# Patient Record
Sex: Male | Born: 1990 | Race: White | Hispanic: Yes | Marital: Single | Smoking: Current every day smoker
Health system: Southern US, Community
[De-identification: ages and names within clinical notes are randomized; demographics above are authoritative.]

---

## 2016-06-03 ENCOUNTER — Ambulatory Visit (INDEPENDENT_AMBULATORY_CARE_PROVIDER_SITE_OTHER): Admitting: Family Medicine

## 2016-06-03 ENCOUNTER — Ambulatory Visit (INDEPENDENT_AMBULATORY_CARE_PROVIDER_SITE_OTHER)

## 2016-06-03 VITALS — BP 114/62 | HR 65 | Temp 98.0°F | Resp 18 | Ht 66.0 in | Wt 164.0 lb

## 2016-06-03 DIAGNOSIS — M25551 Pain in right hip: Secondary | ICD-10-CM

## 2016-06-03 MED ORDER — PREDNISONE 20 MG PO TABS: ORAL_TABLET | ORAL | Status: AC

## 2016-06-03 MED ORDER — HYDROCODONE-ACETAMINOPHEN 5-325 MG PO TABS: 1.0000 | ORAL_TABLET | ORAL | Status: AC | PRN

## 2016-06-03 NOTE — Progress Notes (Addendum)
Subjective:  By signing my name below, I, Christopher Harper, attest that this documentation has been prepared under the direction and in the presence of Christopher SorensonEva Malyssa Maris, MD. Electronically Signed: Stann Oresung-Kai Harper, Scribe. 06/03/2016 , 7:56 PM .  Patient was seen in Room 11 .   Patient ID: Christopher Harper, male    DOB: [birth year], 25 y.o.   MRN: [id number] Chief Complaint  Patient presents with  . Hip Pain    right hip, started yesterday   HPI Christopher CarawayMatthew Arp is a 25 y.o. male who [facility] complaining of right hip pain that started yesterday. He woke up with some soreness in his hip. He felt it when got into car this morning as well. The pain worsened over the course of the day today. He has discomfort when he sits with his legs are closer together. He denies taking medication for his pain today. He denies aggravating pain when coughing or sneezing. He's in the [employer].   History reviewed. No pertinent past medical history. Prior to Admission medications   Not on File   No Known Allergies  Review of Systems  Constitutional: Negative for fever, chills, activity change and fatigue.  Musculoskeletal: Positive for arthralgias. Negative for myalgias, back pain, joint swelling and gait problem.  Skin: Negative for rash and wound.  Neurological: Negative for weakness and numbness.       Objective:   Physical Exam  Constitutional: He is oriented to person, place, and time. He appears well-developed and well-nourished. No distress.  HENT:  Head: Normocephalic and atraumatic.  Eyes: EOM are normal. Pupils are equal, round, and reactive to light.  Neck: Neck supple.  Cardiovascular: Normal rate.   Pulmonary/Chest: Effort normal. No respiratory distress.  Musculoskeletal: Normal range of motion.  Pain with all passive and active ROM, passive flexion limited to 90 degrees with external rotation moderately reduced and internal rotation mildly reduced, tender to palpation over IT band with  minimal tenderness over greater trochanter and SI joint  Neurological: He is alert and oriented to person, place, and time.  Skin: Skin is warm and dry.  Psychiatric: He has a normal mood and affect. His behavior is normal.  Nursing note and vitals reviewed.   BP 114/62 mmHg  Pulse 65  Temp(Src) 98 F (36.7 C) (Oral)  Resp 18  Ht 5\' 6"  (1.676 m)  Wt 164 lb (74.39 kg)  BMI 26.48 kg/m2  SpO2 98%   Dg Hip Unilat W Or W/o Pelvis 2-3 Views Right  06/03/2016  CLINICAL DATA:  Acute hip pain, limited range of movement. EXAM: DG HIP (WITH OR WITHOUT PELVIS) 2-3V RIGHT COMPARISON:  None. FINDINGS: Single view of the pelvis and two views of the right hip are provided. Osseous alignment is normal. Bone mineralization is normal. No fracture line or displaced fracture fragment seen. Probable osseous spurring along the superior-lateral margin of the right acetabulum, suspicious for sequela of previous labral injury. IMPRESSION: Probable osseous spur along the lateral margin of the right acetabulum, suspicious for sequela of previous labral injury. Given the history of limited range of motion, would consider further characterization with nonemergent hip MRI. No acute findings. Electronically Signed   By: Bary RichardStan  Maynard M.D.   On: 06/03/2016 20:15      Assessment & Plan:   1. Hip pain, acute, right   Pt with numerous minor injuries over the years (is active military so very physical job) but no specific h/o hip injury now with insidious onset of hip pain  yesterday - full ROM limited in all directions.  Xray shows bony growth on the lateral right acetabulum - poss calcification of prior injury?  Needs MRI to better eval. Treat acutely with pred taper and refer to ortho for further mngmnt.  Rec limited walking, no running until cleared by ortho.  Orders Placed This Encounter  Procedures  . DG HIP UNILAT W OR W/O PELVIS 2-3 VIEWS RIGHT    Standing Status: Future     Number of Occurrences: 1     Standing  Expiration Date: 06/03/2017    Order Specific Question:  Reason for Exam (SYMPTOM  OR DIAGNOSIS REQUIRED)    Answer:  acute bony hip pain, worse wiht all ROM, limited flexion and ext rotation, nki but in [employer]    Order Specific Question:  Preferred imaging location?    Answer:  External  . MR Hip Right Wo Contrast    Standing Status: Future     Number of Occurrences:      Standing Expiration Date: 08/03/2017    Order Specific Question:  Reason for Exam (SYMPTOM  OR DIAGNOSIS REQUIRED)    Answer:  further evaluate hip abnormality - bone spur on labrum    Order Specific Question:  Preferred imaging location?    Answer:  [facility] (table limit-550lbs)    Order Specific Question:  What is the patient's sedation requirement?    Answer:  No Sedation    Order Specific Question:  Does the patient have a pacemaker or implanted devices?    Answer:  No  . Ambulatory referral to Orthopedic Surgery    Referral Priority:  Routine    Referral Type:  Surgical    Referral Reason:  Specialty Services Required    Requested Specialty:  Orthopedic Surgery    Number of Visits Requested:  1    Meds ordered this encounter  Medications  . predniSONE (DELTASONE) 20 MG tablet    Sig: 3 tabs po qd x2d, 2 tabs po qd x 2d, 1 tab po qd x 2d    Dispense:  12 tablet    Refill:  0  . HYDROcodone-acetaminophen (NORCO/VICODIN) 5-325 MG tablet    Sig: Take 1 tablet by mouth every 4 (four) hours as needed for moderate pain.    Dispense:  40 tablet    Refill:  0    I personally performed the services described in this documentation, which was scribed in my presence. The recorded information has been reviewed and considered, and addended by me as needed.   Christopher SorensonEva Jassen Sarver, M.D.  [facility]  Christopher Harper [address] [place], [place] [address] [phone] phone [phone] fax  06/03/2016 8:48 PM

## 2016-06-03 NOTE — Patient Instructions (Addendum)
Do not use the prednisone with any other otc pain medication other than tylenol/acetaminophen - so no aleve, ibuprofen, motrin, advil, etc.    IF you received an x-ray today, you will receive an invoice from [facility]. Please contact [facility] at [phone] with questions or concerns regarding your invoice.   IF you received labwork today, you will receive an invoice from [facility]. Please contact [facility] at [phone] with questions or concerns regarding your invoice.   Our billing staff will not be able to assist you with questions regarding bills from these companies.  You will be contacted with the lab results as soon as they are available. The fastest way to get your results is to activate your My Chart account. Instructions are located on the last page of this paperwork. If you have not heard from us regarding the results in 2 weeks, please contact this office.     Hip Pain Your hip is the joint between your upper legs and your lower pelvis. The bones, cartilage, tendons, and muscles of your hip joint perform a lot of work each day supporting your body weight and allowing you to move around. Hip pain can range from a minor ache to severe pain in one or both of your hips. Pain may be felt on the inside of the hip joint near the groin, or the outside near the buttocks and upper thigh. You may have swelling or stiffness as well.  HOME CARE INSTRUCTIONS   Take medicines only as directed by your health care provider.  Apply ice to the injured area:  Put ice in a plastic bag.  Place a towel between your skin and the bag.  Leave the ice on for 15-20 minutes at a time, 3-4 times a day.  Keep your leg raised (elevated) when possible to lessen swelling.  Avoid activities that cause pain.  Follow specific exercises as directed by your health care provider.  Sleep with a pillow between your legs on your most comfortable  side.  Record how often you have hip pain, the location of the pain, and what it feels like. SEEK MEDICAL CARE IF:   You are unable to put weight on your leg.  Your hip is red or swollen or very tender to touch.  Your pain or swelling continues or worsens after 1 week.  You have increasing difficulty walking.  You have a fever. SEEK IMMEDIATE MEDICAL CARE IF:   You have fallen.  You have a sudden increase in pain and swelling in your hip. MAKE SURE YOU:   Understand these instructions.  Will watch your condition.  Will get help right away if you are not doing well or get worse.   This information is not intended to replace advice given to you by your health care provider. Make sure you discuss any questions you have with your health care provider.   Document Released: 05/15/2010 Document Revised: 12/16/2014 Document Reviewed: 07/22/2013 Elsevier Interactive Patient Education [employer]2016 Elsevier Inc.

## 2016-06-04 ENCOUNTER — Telehealth: Payer: Self-pay

## 2016-06-04 NOTE — Telephone Encounter (Signed)
I have called pt and let him know his CD of an x-ray for  his hip from 06/03/16 is ready for pickup. His mailbox for voice mail is full, so no message was left.

## 2016-06-12 ENCOUNTER — Ambulatory Visit
Admission: RE | Admit: 2016-06-12 | Discharge: 2016-06-12 | Disposition: A | Discharge status: Home / Self Care | Source: Ambulatory Visit | Attending: Family Medicine | Admitting: Family Medicine

## 2016-06-12 ENCOUNTER — Other Ambulatory Visit: Payer: Self-pay | Admitting: Family Medicine

## 2016-06-12 DIAGNOSIS — M25551 Pain in right hip: Secondary | ICD-10-CM

## 2016-06-12 DIAGNOSIS — Z189 Retained foreign body fragments, unspecified material: Principal | ICD-10-CM

## 2016-06-12 DIAGNOSIS — H0553 Retained (old) foreign body following penetrating wound of bilateral orbits: Secondary | ICD-10-CM

## 2016-06-17 ENCOUNTER — Telehealth: Payer: Self-pay

## 2016-06-17 NOTE — Telephone Encounter (Signed)
Dr. Clelia CroftShaw, have you had a change to review these?

## 2016-06-17 NOTE — Telephone Encounter (Signed)
Pt is looking for mri results  Best number [phone]

## 2016-06-18 NOTE — Telephone Encounter (Signed)
Sent to rad pool 

## 2016-06-24 ENCOUNTER — Telehealth: Payer: Self-pay | Admitting: Radiology

## 2016-06-24 ENCOUNTER — Encounter: Payer: Self-pay | Admitting: Radiology

## 2016-06-24 NOTE — Telephone Encounter (Addendum)
We did refer him to ortho [facility] on 6/28 - they told us that when they called him to sched an appt, he declined stating that he was already being seen by a different orthopedist.   If he has changed his mind, he can call the office that contacted him (all of this info was in the referral notes.)

## 2016-06-24 NOTE — Telephone Encounter (Signed)
Gave [facility] info [facility]. Pt states he will call and make appt.

## 2016-06-24 NOTE — Telephone Encounter (Signed)
Pt called back concerning previous message. Pt states he needs a referral to an orthopedist. Also, pt stated that a better contact number for him during the day is his work phone. [phone] ext [phone].

## 2018-01-29 IMAGING — CR DG ORBITS FOR FOREIGN BODY
2 series · 2 of 2 positions shown · non-contrast
Comparison: None.

CLINICAL DATA: Metal working/exposure; clearance prior to MRI

EXAM:
ORBITS FOR FOREIGN BODY - 2 VIEW

[w orbit pa (1 of 2)]
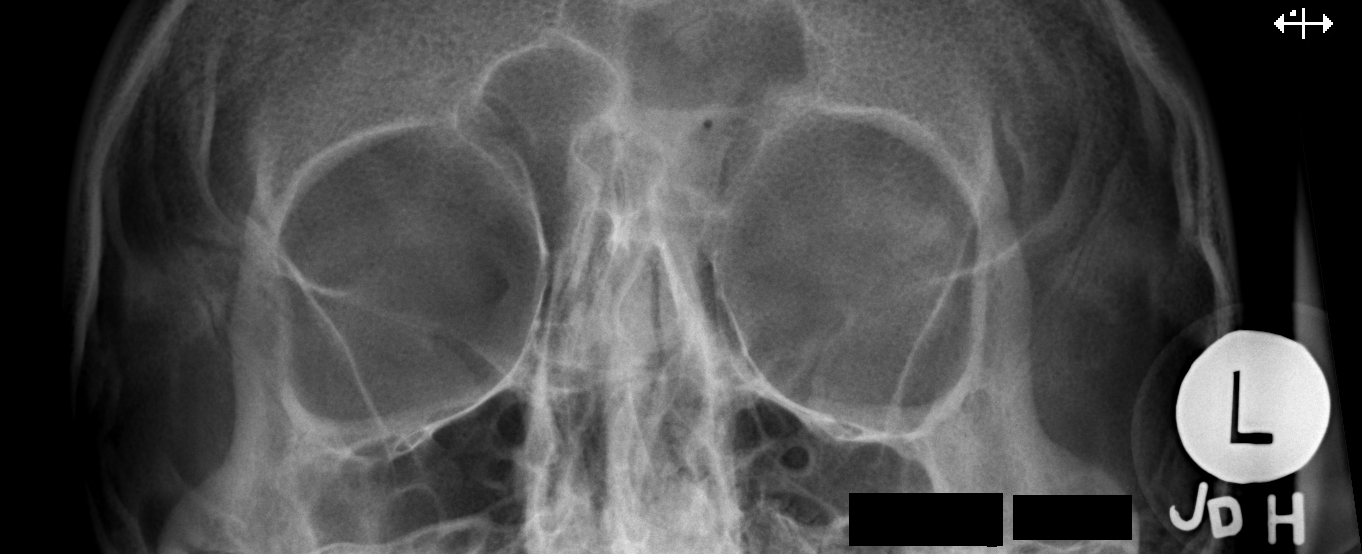

[w orbit pa (2 of 2)]
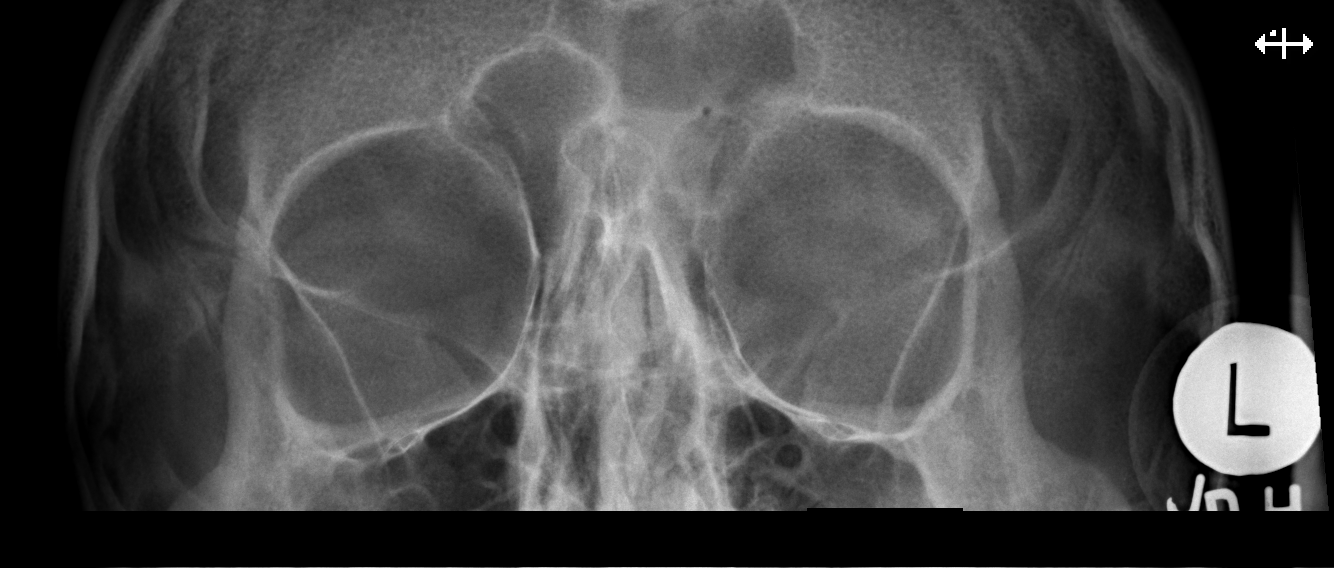

[2 of 2 positions shown; findings below may reference images not displayed]

FINDINGS: There is no evidence of metallic foreign body within the orbits. No
significant bone abnormality identified.
IMPRESSION: No evidence of metallic foreign body within the orbits.

## 2021-06-14 ENCOUNTER — Other Ambulatory Visit: Payer: Self-pay

## 2021-06-14 ENCOUNTER — Encounter: Payer: Self-pay | Admitting: Physical Therapy

## 2021-06-14 ENCOUNTER — Ambulatory Visit: Payer: No Typology Code available for payment source | Attending: Neurosurgery | Admitting: Physical Therapy

## 2021-06-14 DIAGNOSIS — R293 Abnormal posture: Secondary | ICD-10-CM | POA: Diagnosis present

## 2021-06-14 DIAGNOSIS — M62838 Other muscle spasm: Secondary | ICD-10-CM | POA: Insufficient documentation

## 2021-06-14 DIAGNOSIS — M542 Cervicalgia: Secondary | ICD-10-CM | POA: Diagnosis present

## 2021-06-14 DIAGNOSIS — G4486 Cervicogenic headache: Secondary | ICD-10-CM | POA: Insufficient documentation
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
## 2021-06-26 ENCOUNTER — Ambulatory Visit: Payer: No Typology Code available for payment source

## 2021-06-27 ENCOUNTER — Encounter: Payer: Self-pay | Admitting: Physical Therapy

## 2021-06-27 ENCOUNTER — Ambulatory Visit: Payer: No Typology Code available for payment source | Admitting: Physical Therapy

## 2021-06-27 ENCOUNTER — Other Ambulatory Visit: Payer: Self-pay

## 2021-06-27 DIAGNOSIS — M542 Cervicalgia: Secondary | ICD-10-CM | POA: Diagnosis not present

## 2021-06-27 DIAGNOSIS — R293 Abnormal posture: Secondary | ICD-10-CM

## 2021-06-27 DIAGNOSIS — M62838 Other muscle spasm: Secondary | ICD-10-CM

## 2021-06-27 DIAGNOSIS — G4486 Cervicogenic headache: Secondary | ICD-10-CM

## 2021-06-27 NOTE — Therapy (Signed)
[facility] Outpatient Rehabilitation [facility] [address]  [address] [place], [place], [address] Phone: [phone]   Fax:  [phone]  Physical Therapy Treatment  Patient Details  Name: Christopher Harper MRN: [id number] Date of Birth: 05-Apr-1991 Referring Provider (PT): Donalee Citrin, MD   Encounter Date: 06/27/2021   PT End of Session - 06/27/21 1701     Visit Number 2    Number of Visits 13    Date for PT Re-Evaluation 07/26/21    Authorization Type [place]    Authorization - Visit Number 2    Authorization - Number of Visits 15    PT Start Time 1619    PT Stop Time 1659    PT Time Calculation (min) 40 min    Activity Tolerance Patient tolerated treatment well;Patient limited by pain    Behavior During Therapy [facility] for tasks assessed/performed             History reviewed. No pertinent past medical history.  History reviewed. No pertinent surgical history.  There were no vitals filed for this visit.   Subjective Assessment - 06/27/21 1620     Subjective "Nothing has changed." 2 nights ago he put his head down and felt very lightheaded. Touched the back of his neck and it felt "squishy" and "swollen." Denies any other new symptoms. Has been doing a lot of yardwork for the past couple of days.    Pertinent History hx several concussions, reports hx herniated discs in mid back    Diagnostic tests per pt- had xray and MRI done on c-spine recently- reports that [place] said that it showed narrowing around the nerves; unable to access this today    Patient Stated Goals decrease pain    Currently in Pain? Yes    Pain Score 5     Pain Location Neck    Pain Orientation Left    Pain Descriptors / Indicators Pressure    Pain Type Chronic pain                [facility] PT Assessment - 06/27/21 1630       Observation/Other Assessments   Focus on Therapeutic Outcomes (FOTO)  Cervical: 25                           [facility] Adult PT  Treatment/Exercise - 06/27/21 0001       Neck Exercises: Machines for Strengthening   UBE (Upper Arm Bike) L2.0 x 3 min forward/3 min back      Neck Exercises: Standing   Other Standing Exercises green TB row x15   cues to avoid shoulder extension past neutral     Neck Exercises: Seated   Neck Retraction 10 reps    Neck Retraction Limitations 10x3"   10x with and without yellow TB   Cervical Rotation Right;Left;10 reps    Cervical Rotation Limitations R/L rotation SNAG   correction of hand positioning required; cueing to avoid pushing into pain and liting chin     Manual Therapy   Manual Therapy Soft tissue mobilization;Myofascial release;Manual Traction    Manual therapy comments supine    Soft tissue mobilization STM to B UT, LS, cervical paraspinals, suboccipitals   c/o TTP throughout   Myofascial Release B suboccipital release    Manual Traction with towel assist 2x30"      Neck Exercises: Stretches   Upper Trapezius Stretch Right;Left;1 rep;10 seconds    Levator Stretch Right;Left;1  rep;30 seconds   cues to tuck chin                     PT Short Term Goals - 06/27/21 1702       PT SHORT TERM GOAL #1   Title Patient to be independent with initial HEP.    Time 3    Period Weeks    Status Achieved    Target Date 07/05/21               PT Long Term Goals - 06/27/21 1702       PT LONG TERM GOAL #1   Title Patient to be independent with advanced HEP.    Time 6    Period Weeks    Status On-going      PT LONG TERM GOAL #2   Title Patient to demonstrate cervical AROM WFL and without pain limiting.    Time 6    Period Weeks    Status On-going      PT LONG TERM GOAL #3   Title Patient to report 50% improvement in frequency and intensity of HAs.    Time 6    Period Weeks    Status On-going      PT LONG TERM GOAL #4   Title Patient to demonstrate and recall postural correction at rest and with activity for improved postural awareness.    Time 6     Period Weeks    Status On-going                   Plan - 06/27/21 1702     Clinical Impression Statement Patient arrived to session with report of an episode of lightheadedness when looking down 2 days ago. Denies any other symptoms at that time and denies any other new symptoms since. Reviewed HEP with patient demonstrating good amplitude and form with cervical retractions. Able to progress to light resistance. Correction of hand positioning and alignment required with cervical rotation. Gentle cervical stretching was performed without complaints. Remainder of session focused on MT to address soft tissue restriction and pain. Patient reported pain no worse at end of session.    Personal Factors and Comorbidities Age;Comorbidity 2;Past/Current Experience;Time since onset of injury/illness/exacerbation    Comorbidities migraines, concussions    Examination-Activity Limitations Sleep;Caring for Others;Carry;Dressing;Lift    Examination-Participation Restrictions Occupation;Meal Prep;Laundry;Yard Work;Driving;Community Activity;Shop;Cleaning;Church    Stability/Clinical Decision Making Stable/Uncomplicated    Rehab Potential Good    PT Frequency 2x / week    PT Duration 6 weeks    PT Treatment/Interventions ADLs/Self Care Home Management;Cryotherapy;Electrical Stimulation;Moist Heat;Traction;Therapeutic exercise;Therapeutic activities;Functional mobility training;Ultrasound;Neuromuscular re-education;Patient/family education;Manual techniques;Taping;Energy conservation;Dry needling;Passive range of motion    PT Next Visit Plan progress postural strengthening and cervical stretching to tolerance; STM    Consulted and Agree with Plan of Care Patient             Patient will benefit from skilled therapeutic intervention in order to improve the following deficits and impairments:  Hypomobility, Decreased activity tolerance, Pain, Increased fascial restricitons, Increased muscle spasms,  Improper body mechanics, Decreased range of motion, Dizziness, Postural dysfunction, Impaired flexibility  Visit Diagnosis: Cervicalgia  Cervicogenic headache  Other muscle spasm  Abnormal posture     Problem List There are no problems to display for this patient.   Anette Guarneri, PT, DPT 06/27/21 5:03 PM    [facility] Outpatient Rehabilitation [facility] [address]  [address] [place], [place], [address]  Phone: [phone]   Fax:  [phone]  Name: Christopher Harper MRN: [id number] Date of Birth: 10-19-1991

## 2021-07-03 ENCOUNTER — Encounter: Payer: Non-veteran care | Admitting: Physical Therapy

## 2021-07-04 ENCOUNTER — Encounter: Payer: Self-pay | Admitting: Physical Therapy

## 2021-07-04 ENCOUNTER — Ambulatory Visit: Payer: No Typology Code available for payment source | Admitting: Physical Therapy

## 2021-07-04 ENCOUNTER — Other Ambulatory Visit: Payer: Self-pay

## 2021-07-04 DIAGNOSIS — R293 Abnormal posture: Secondary | ICD-10-CM

## 2021-07-04 DIAGNOSIS — M62838 Other muscle spasm: Secondary | ICD-10-CM

## 2021-07-04 DIAGNOSIS — M542 Cervicalgia: Secondary | ICD-10-CM

## 2021-07-04 DIAGNOSIS — G4486 Cervicogenic headache: Secondary | ICD-10-CM

## 2021-07-04 NOTE — Therapy (Signed)
[facility] Outpatient Rehabilitation [facility] [address]  [address] [place], [place], [address] Phone: [phone]   Fax:  [phone]  Physical Therapy Treatment  Patient Details  Name: Christopher Harper MRN: [id number] Date of Birth: 11/06/1991 Referring Provider (PT): Donalee Citrin, MD   Encounter Date: 07/04/2021   PT End of Session - 07/04/21 1613     Visit Number 3    Number of Visits 13    Date for PT Re-Evaluation 07/26/21    Authorization Type VA    Authorization - Visit Number 3    Authorization - Number of Visits 15    PT Start Time 1538   pt late   PT Stop Time 1612    PT Time Calculation (min) 34 min    Activity Tolerance Patient tolerated treatment well    Behavior During Therapy [facility] for tasks assessed/performed             History reviewed. No pertinent past medical history.  History reviewed. No pertinent surgical history.  There were no vitals filed for this visit.   Subjective Assessment - 07/04/21 1539     Subjective Doing "bad. Nothing's changed." Reports that when his wife was pushing on his neck she said that she could feel a pop in his neck which does not hurt. When using the weedeater he noticed some R UE N/T which is new to him. Missed last session because he had a HA.    Pertinent History hx several concussions, reports hx herniated discs in mid back    Diagnostic tests per pt- had xray and MRI done on c-spine recently- reports that [place] said that it showed narrowing around the nerves; unable to access this today    Patient Stated Goals decrease pain    Currently in Pain? Yes    Pain Score 5     Pain Location Neck   and headache   Pain Orientation Right;Left    Pain Descriptors / Indicators Aching    Pain Type Chronic pain                               [facility] Adult PT Treatment/Exercise - 07/04/21 0001       Neck Exercises: Machines for Strengthening   UBE (Upper Arm Bike) L2.0 x 3 min forward/3 min  back      Neck Exercises: Standing   Neck Retraction 10 reps;3 secs    Neck Retraction Limitations into ball on wall    Other Standing Exercises B shoulder ER with red TB 2x10 standing at doorframe; B shoulder horizontal abduction with red TB 2x10   cueing for scapular retraction and slight chin tuck   Other Standing Exercises R/L open book stretch at wall 10x each; thoracic extension at wall 10x3"   cues to look at hand with each rotation     Neck Exercises: Prone   Other Prone Exercise prone over pball B I, T, Y's 10x each   reported "something shifted in my shoulder" but able to continue without complaints; cues for slight chin tuck     Neck Exercises: Stretches   Corner Stretch 2 reps;30 seconds   90 deg shoulder; cueing to adjust intensity and angle for comfort                   PT Education - 07/04/21 1613     Education Details update to HEP; edu on importance  of slight chin tuck with activities    Person(s) Educated Patient    Methods Explanation;Demonstration;Tactile cues;Verbal cues;Handout    Comprehension Verbalized understanding;Returned demonstration              PT Short Term Goals - 06/27/21 1702       PT SHORT TERM GOAL #1   Title Patient to be independent with initial HEP.    Time 3    Period Weeks    Status Achieved    Target Date 07/05/21               PT Long Term Goals - 06/27/21 1702       PT LONG TERM GOAL #1   Title Patient to be independent with advanced HEP.    Time 6    Period Weeks    Status On-going      PT LONG TERM GOAL #2   Title Patient to demonstrate cervical AROM WFL and without pain limiting.    Time 6    Period Weeks    Status On-going      PT LONG TERM GOAL #3   Title Patient to report 50% improvement in frequency and intensity of HAs.    Time 6    Period Weeks    Status On-going      PT LONG TERM GOAL #4   Title Patient to demonstrate and recall postural correction at rest and with activity for  improved postural awareness.    Time 6    Period Weeks    Status On-going                   Plan - 07/04/21 1614     Clinical Impression Statement Patient arrived to session with report of new onset of R UE N/T when using the weed eater which has since resolved. Patient performed postural strengthening ther-ex with intermittent cueing to correct form. Cueing was given to incorporate cervical rotation with trunk rotation ther-ex- cervical motion was limiting factor here. Provided cueing throughout ther-ex to adjust intensity and angle for patient's comfort. Patient performed periscapular strengthening with doorframe as tactile cue for improved cervical and scapular alignment. Patient reported understanding of HEP update and without complaints at end of session.    Personal Factors and Comorbidities Age;Comorbidity 2;Past/Current Experience;Time since onset of injury/illness/exacerbation    Comorbidities migraines, concussions    Examination-Activity Limitations Sleep;Caring for Others;Carry;Dressing;Lift    Examination-Participation Restrictions Occupation;Meal Prep;Laundry;Yard Work;Driving;Community Activity;Shop;Cleaning;Church    Stability/Clinical Decision Making Stable/Uncomplicated    Rehab Potential Good    PT Frequency 2x / week    PT Duration 6 weeks    PT Treatment/Interventions ADLs/Self Care Home Management;Cryotherapy;Electrical Stimulation;Moist Heat;Traction;Therapeutic exercise;Therapeutic activities;Functional mobility training;Ultrasound;Neuromuscular re-education;Patient/family education;Manual techniques;Taping;Energy conservation;Dry needling;Passive range of motion    PT Next Visit Plan progress postural strengthening and cervical stretching to tolerance; STM    Consulted and Agree with Plan of Care Patient             Patient will benefit from skilled therapeutic intervention in order to improve the following deficits and impairments:  Hypomobility,  Decreased activity tolerance, Pain, Increased fascial restricitons, Increased muscle spasms, Improper body mechanics, Decreased range of motion, Dizziness, Postural dysfunction, Impaired flexibility  Visit Diagnosis: Cervicalgia  Cervicogenic headache  Other muscle spasm  Abnormal posture     Problem List There are no problems to display for this patient.    Anette Guarneri, PT, DPT 07/04/21 4:15 PM    [place] [facility]  [facility] [address]  [address] [place], [place], [address] Phone: [phone]   Fax:  [phone]  Name: Christopher Harper MRN: [id number] Date of Birth: September 01, 1991

## 2021-07-10 ENCOUNTER — Other Ambulatory Visit: Payer: Self-pay

## 2021-07-10 ENCOUNTER — Encounter: Payer: Self-pay | Admitting: Physical Therapy

## 2021-07-10 ENCOUNTER — Ambulatory Visit: Payer: No Typology Code available for payment source | Attending: Neurosurgery | Admitting: Physical Therapy

## 2021-07-10 DIAGNOSIS — M62838 Other muscle spasm: Secondary | ICD-10-CM | POA: Insufficient documentation

## 2021-07-10 DIAGNOSIS — R293 Abnormal posture: Secondary | ICD-10-CM | POA: Diagnosis present

## 2021-07-10 DIAGNOSIS — G4486 Cervicogenic headache: Secondary | ICD-10-CM | POA: Insufficient documentation

## 2021-07-10 DIAGNOSIS — M542 Cervicalgia: Secondary | ICD-10-CM | POA: Insufficient documentation

## 2021-07-10 NOTE — Therapy (Signed)
[facility] Outpatient Rehabilitation [facility] [address]  [address] [place], [place], [address] Phone: [phone]   Fax:  [phone]  Physical Therapy Treatment  Patient Details  Name: Christopher Harper MRN: [id number] Date of Birth: [birth year] Referring Provider (PT): Donalee Citrin, MD   Encounter Date: 07/10/2021   PT End of Session - 07/10/21 1822     Visit Number 4    Number of Visits 13    Date for PT Re-Evaluation 07/26/21    Authorization Type VA    Authorization - Visit Number 3    Authorization - Number of Visits 15    PT Start Time 1448    PT Stop Time 1536    PT Time Calculation (min) 48 min    Activity Tolerance Patient limited by pain    Behavior During Therapy [facility] for tasks assessed/performed             History reviewed. No pertinent past medical history.  History reviewed. No pertinent surgical history.  There were no vitals filed for this visit.   Subjective Assessment - 07/10/21 1452     Subjective Doing about the same as before.    Pertinent History hx several concussions, reports hx herniated discs in mid back    Diagnostic tests per pt- had xray and MRI done on c-spine recently- reports that [place] said that it showed narrowing around the nerves; unable to access this today    Patient Stated Goals decrease pain    Currently in Pain? Yes    Pain Location Neck    Multiple Pain Sites Yes                               [facility] Adult PT Treatment/Exercise - 07/10/21 0001       Self-Care   Self-Care Other Self-Care Comments;Heat/Ice Application    Heat/Ice Application education on use of heat or ice    Other Self-Care Comments  education on pain science      Neck Exercises: Machines for Strengthening   UBE (Upper Arm Bike) L2.0 x 3 min forward/3 min back      Neck Exercises: Seated   Cervical Rotation Right;20 reps    Cervical Rotation Limitations test/retest for Rotation to L      Neck Exercises: Supine    Other Supine Exercise thoracic extension on foam roller      Manual Therapy   Manual Therapy Joint mobilization;Soft tissue mobilization    Manual therapy comments supine    Joint Mobilization NAGS into rotation, and R UPA mobs grade 2-3    Soft tissue mobilization STM to B UT, LS, cervical paraspinals, suboccipitals    Myofascial Release B suboccipital release    Manual Traction with towel assist 2x30"                      PT Short Term Goals - 06/27/21 1702       PT SHORT TERM GOAL #1   Title Patient to be independent with initial HEP.    Time 3    Period Weeks    Status Achieved    Target Date 07/05/21               PT Long Term Goals - 06/27/21 1702       PT LONG TERM GOAL #1   Title Patient to be independent with advanced HEP.    Time  6    Period Weeks    Status On-going      PT LONG TERM GOAL #2   Title Patient to demonstrate cervical AROM WFL and without pain limiting.    Time 6    Period Weeks    Status On-going      PT LONG TERM GOAL #3   Title Patient to report 50% improvement in frequency and intensity of HAs.    Time 6    Period Weeks    Status On-going      PT LONG TERM GOAL #4   Title Patient to demonstrate and recall postural correction at rest and with activity for improved postural awareness.    Time 6    Period Weeks    Status On-going                   Plan - 07/10/21 1532     Clinical Impression Statement Patient continues to report neck pain and headaches without relief.  Educated about dry needling including demonstration with anatomical models as he expressed concerns about needling in the neck/spine area.  He would prefer trying next session, not this session.  He did not report any decreased pain or tightness today with interventions, including shortening tight muscles rather than stretching.  Reported no improvement in headache at end of session but light was aggravating symptoms as well.  Pt. would benefit  from continued skilled therapy to manage symptoms and improve cervical ROM.    Personal Factors and Comorbidities Age;Comorbidity 2;Past/Current Experience;Time since onset of injury/illness/exacerbation    Comorbidities migraines, concussions    Examination-Activity Limitations Sleep;Caring for Others;Carry;Dressing;Lift    Examination-Participation Restrictions Occupation;Meal Prep;Laundry;Yard Work;Driving;Community Activity;Shop;Cleaning;Church    Stability/Clinical Decision Making Stable/Uncomplicated    Rehab Potential Good    PT Frequency 2x / week    PT Duration 6 weeks    PT Treatment/Interventions ADLs/Self Care Home Management;Cryotherapy;Electrical Stimulation;Moist Heat;Traction;Therapeutic exercise;Therapeutic activities;Functional mobility training;Ultrasound;Neuromuscular re-education;Patient/family education;Manual techniques;Taping;Energy conservation;Dry needling;Passive range of motion    PT Next Visit Plan willing to try DN next session.  Continue to progress postural strengthening and STM    Consulted and Agree with Plan of Care Patient             Patient will benefit from skilled therapeutic intervention in order to improve the following deficits and impairments:  Hypomobility, Decreased activity tolerance, Pain, Increased fascial restricitons, Increased muscle spasms, Improper body mechanics, Decreased range of motion, Dizziness, Postural dysfunction, Impaired flexibility  Visit Diagnosis: Cervicalgia  Cervicogenic headache  Other muscle spasm  Abnormal posture     Problem List There are no problems to display for this patient.   Jena Gauss PT, DPT 07/10/2021, 6:28 PM  [facility] [address]  [address] [place], [place], [address] Phone: [phone]   Fax:  [phone]  Name: Evie Crumpler MRN: [id number] Date of Birth: [birth year]

## 2021-07-12 ENCOUNTER — Other Ambulatory Visit: Payer: Self-pay

## 2021-07-12 ENCOUNTER — Ambulatory Visit: Payer: No Typology Code available for payment source | Admitting: Physical Therapy

## 2021-07-12 DIAGNOSIS — M62838 Other muscle spasm: Secondary | ICD-10-CM

## 2021-07-12 DIAGNOSIS — M542 Cervicalgia: Secondary | ICD-10-CM | POA: Diagnosis not present

## 2021-07-12 DIAGNOSIS — R293 Abnormal posture: Secondary | ICD-10-CM

## 2021-07-12 DIAGNOSIS — G4486 Cervicogenic headache: Secondary | ICD-10-CM

## 2021-07-12 NOTE — Therapy (Signed)
[facility] Outpatient Rehabilitation [facility] [address]  [address] [place], [place], [address] Phone: [phone]   Fax:  [phone]  Physical Therapy Treatment  Patient Details  Name: Christopher Harper MRN: [id number] Date of Birth: [birth year] Referring Provider (PT): Donalee Citrin, MD   Encounter Date: 07/12/2021   PT End of Session - 07/12/21 1604     Visit Number 5    Number of Visits 13    Date for PT Re-Evaluation 07/26/21    Authorization Type VA    Authorization - Visit Number 3    Authorization - Number of Visits 15    PT Start Time 0331    PT Stop Time 0400    PT Time Calculation (min) 29 min    Activity Tolerance Patient tolerated treatment well    Behavior During Therapy [facility] for tasks assessed/performed             No past medical history on file.  No past surgical history on file.  There were no vitals filed for this visit.   Subjective Assessment - 07/12/21 1533     Subjective The same, although head ache is not as bad    Pertinent History hx several concussions, reports hx herniated discs in mid back    Diagnostic tests per pt- had xray and MRI done on c-spine recently- reports that [place] said that it showed narrowing around the nerves; unable to access this today    Patient Stated Goals decrease pain    Currently in Pain? Yes    Pain Score 4     Pain Location Neck    Pain Orientation Right;Left    Pain Descriptors / Indicators Aching    Pain Type Chronic pain                               [facility] Adult PT Treatment/Exercise - 07/12/21 0001       Exercises   Exercises Neck      Neck Exercises: Machines for Strengthening   UBE (Upper Arm Bike) L2.0 x 3 min forward/3 min back      Neck Exercises: Theraband   Shoulder External Rotation 20 reps;Red    Shoulder External Rotation Limitations bilateral    Other Theraband Exercises D1, GTB 2 x 10 bil      Neck Exercises: Standing   Neck Retraction 10 reps     Neck Retraction Limitations into ball on wall    Wall Push Ups 20 reps    Other Standing Exercises wall angels 2 x 10      Neck Exercises: Prone   Other Prone Exercise Quadruped - bird dogs x 10    Other Prone Exercise Open book stretches in sidelying x 10 bil                      PT Short Term Goals - 06/27/21 1702       PT SHORT TERM GOAL #1   Title Patient to be independent with initial HEP.    Time 3    Period Weeks    Status Achieved    Target Date 07/05/21               PT Long Term Goals - 06/27/21 1702       PT LONG TERM GOAL #1   Title Patient to be independent with advanced HEP.    Time 6  Period Weeks    Status On-going      PT LONG TERM GOAL #2   Title Patient to demonstrate cervical AROM WFL and without pain limiting.    Time 6    Period Weeks    Status On-going      PT LONG TERM GOAL #3   Title Patient to report 50% improvement in frequency and intensity of HAs.    Time 6    Period Weeks    Status On-going      PT LONG TERM GOAL #4   Title Patient to demonstrate and recall postural correction at rest and with activity for improved postural awareness.    Time 6    Period Weeks    Status On-going                   Plan - 07/12/21 1605     Clinical Impression Statement Pt reported slight improvement and neck pain today.  Unfortunately, he received a call after session started that needed to leave early to take wife to OB-GYN, so did not perform dry needling today and focused on postural strengthening only.  He still reports "shoulders shifting" with quadruped exercises, but no pain and no signs of shoulder instability.  He did report some R hip pain (chronic from previous injuries) with sidelying and extension with bird dogs.  He would benefit from continued skilled therapy.    Personal Factors and Comorbidities Age;Comorbidity 2;Past/Current Experience;Time since onset of injury/illness/exacerbation    Comorbidities  migraines, concussions    Examination-Activity Limitations Sleep;Caring for Others;Carry;Dressing;Lift    Examination-Participation Restrictions Occupation;Meal Prep;Laundry;Yard Work;Driving;Community Activity;Shop;Cleaning;Church    Stability/Clinical Decision Making Stable/Uncomplicated    Rehab Potential Good    PT Frequency 2x / week    PT Duration 6 weeks    PT Treatment/Interventions ADLs/Self Care Home Management;Cryotherapy;Electrical Stimulation;Moist Heat;Traction;Therapeutic exercise;Therapeutic activities;Functional mobility training;Ultrasound;Neuromuscular re-education;Patient/family education;Manual techniques;Taping;Energy conservation;Dry needling;Passive range of motion    PT Next Visit Plan willing to try DN next session.  Continue to progress postural strengthening and STM    Consulted and Agree with Plan of Care Patient             Patient will benefit from skilled therapeutic intervention in order to improve the following deficits and impairments:  Hypomobility, Decreased activity tolerance, Pain, Increased fascial restricitons, Increased muscle spasms, Improper body mechanics, Decreased range of motion, Dizziness, Postural dysfunction, Impaired flexibility  Visit Diagnosis: Cervicalgia  Cervicogenic headache  Other muscle spasm  Abnormal posture     Problem List There are no problems to display for this patient.   Jena Gauss PT, DPT 07/12/2021, 4:08 PM  [facility] [address]  [address] [place], [place], [address] Phone: [phone]   Fax:  [phone]  Name: Christopher Harper MRN: [id number] Date of Birth: [birth year]

## 2021-07-25 ENCOUNTER — Other Ambulatory Visit: Payer: Self-pay

## 2021-07-25 ENCOUNTER — Encounter: Payer: Self-pay | Admitting: Physical Therapy

## 2021-07-25 ENCOUNTER — Ambulatory Visit: Payer: No Typology Code available for payment source | Admitting: Physical Therapy

## 2021-07-25 DIAGNOSIS — G4486 Cervicogenic headache: Secondary | ICD-10-CM

## 2021-07-25 DIAGNOSIS — M542 Cervicalgia: Secondary | ICD-10-CM

## 2021-07-25 DIAGNOSIS — M62838 Other muscle spasm: Secondary | ICD-10-CM

## 2021-07-25 DIAGNOSIS — R293 Abnormal posture: Secondary | ICD-10-CM

## 2021-07-25 NOTE — Patient Instructions (Signed)

## 2021-07-25 NOTE — Therapy (Signed)
[facility] [address]  [place], [place], [address] Phone: [phone]   Fax:  [phone]  Physical Therapy Treatment  Patient Details  Name: Christopher Harper MRN: [id number] Date of Birth: 28-Oct-1991 Referring Provider (PT): Kary Kos, MD   Encounter Date: 07/25/2021   PT End of Session - 07/25/21 1453     Visit Number 6    Number of Visits 13    Date for PT Re-Evaluation 08/22/21    Authorization Type [facility]    Authorization - Visit Number 6    Authorization - Number of Visits 15    PT Start Time 5573    PT Stop Time 1530    PT Time Calculation (min) 41 min    Activity Tolerance Patient tolerated treatment well    Behavior During Therapy [facility] for tasks assessed/performed             History reviewed. No pertinent past medical history.  History reviewed. No pertinent surgical history.  There were no vitals filed for this visit.   Subjective Assessment - 07/25/21 1451     Subjective Patient reports that today is pretty good headache wise, just more tired feeling and pressure behind eyes, but hasn't done much today either, stayed inside.    Pertinent History hx several concussions, reports hx herniated discs in mid back    Diagnostic tests per pt- had xray and MRI done on c-spine recently- reports that [place] said that it showed narrowing around the nerves; unable to access this today    Patient Stated Goals decrease pain    Currently in Pain? Yes    Pain Score 4     Pain Location Neck    Pain Descriptors / Indicators Headache    Pain Type Chronic pain                               [facility] Adult PT Treatment/Exercise - 07/25/21 0001       Exercises   Exercises Neck      Neck Exercises: Machines for Strengthening   UBE (Upper Arm Bike) L2.0 x 3 min forward/3 min back      Neck Exercises: Stretches   Corner Stretch 2 reps;30 seconds    Other Neck Stretches scalene stretch with belt  3 x 15 sec    Other Neck Stretches 1st rib mob with belt 3 x 30 sec      Manual Therapy   Manual Therapy Joint mobilization;Soft tissue mobilization;Other (comment)   dry needling   Joint Mobilization PA mobs thoracic spine grade 2-3, L 1st rib mobs    Soft tissue mobilization STM to cervical paraspinals, L L/S and L UT              Trigger Point Dry Needling - 07/25/21 0001     Consent Given? Yes    Education Handout Provided Yes    Muscles Treated Head and Neck Upper trapezius;Levator scapulae;Cervical multifidi    Dry Needling Comments , L side    Upper Trapezius Response Twitch reponse elicited;Palpable increased muscle length    Levator Scapulae Response Twitch response elicited;Palpable increased muscle length    Cervical multifidi Response Twitch reponse elicited;Palpable increased muscle length                  PT Education - 07/25/21 1906     Education Details education on dry needling, aftercare, precautions.  Person(s) Educated Patient    Methods Explanation;Handout    Comprehension Verbalized understanding              PT Short Term Goals - 06/27/21 1702       PT SHORT TERM GOAL #1   Title Patient to be independent with initial HEP.    Time 3    Period Weeks    Status Achieved    Target Date 07/05/21               PT Long Term Goals - 07/25/21 1454       PT LONG TERM GOAL #1   Title Patient to be independent with advanced HEP.    Time 4    Period Weeks    Status On-going   continue to progress   Target Date 08/22/21      PT LONG TERM GOAL #2   Title Patient to demonstrate cervical AROM WFL and without pain limiting.    Time 4    Period Weeks    Status On-going    Target Date 08/22/21      PT LONG TERM GOAL #3   Title Patient to report 50% improvement in frequency and intensity of HAs.    Time 4    Period Weeks    Status On-going   no noticeable improvement   Target Date 08/22/21      PT LONG TERM GOAL #4   Title  Patient to demonstrate and recall postural correction at rest and with activity for improved postural awareness.    Time 4    Period Weeks    Status On-going    Target Date 08/22/21                   Plan - 07/25/21 1908     Clinical Impression Statement Patient reported better week today in terms of headache.  He demonstrated significant muscle spasm and trigger points in L UT, L L/S and cervical paraspinals, also noted elevated 1st rib on L and tightness in scalenes, had difficulty initially with corner stretch with L arm going numb, suspect possible thoracic outlet syndrome, but with modifications able to perform, also given 1st rib self mobs with belt.  Trialed dry needling today to L cervical multifidus, L UT and L L/S (deferred suboccipitals to avoid increasing head ache potentially) followed by thoracic mobs as been reporting significant tightness in this area.  He responded well to interventions today and would benefit from continued threapy.  POC extended 5 weeks today to allow more visits as he has not yet met his goals.    Personal Factors and Comorbidities Age;Comorbidity 2;Past/Current Experience;Time since onset of injury/illness/exacerbation    Comorbidities migraines, concussions    Examination-Activity Limitations Sleep;Caring for Others;Carry;Dressing;Lift    Examination-Participation Restrictions Occupation;Meal Prep;Laundry;Yard Work;Driving;Community Activity;Shop;Cleaning;Church    Stability/Clinical Decision Making Stable/Uncomplicated    Rehab Potential Good    PT Frequency 2x / week    PT Duration 6 weeks    PT Treatment/Interventions ADLs/Self Care Home Management;Cryotherapy;Electrical Stimulation;Moist Heat;Traction;Therapeutic exercise;Therapeutic activities;Functional mobility training;Ultrasound;Neuromuscular re-education;Patient/family education;Manual techniques;Taping;Energy conservation;Dry needling;Passive range of motion    PT Next Visit Plan willing  to try DN next session.  Continue to progress postural strengthening and STM    Consulted and Agree with Plan of Care Patient             Patient will benefit from skilled therapeutic intervention in order to improve the following deficits and impairments:  Hypomobility, Decreased  activity tolerance, Pain, Increased fascial restricitons, Increased muscle spasms, Improper body mechanics, Decreased range of motion, Dizziness, Postural dysfunction, Impaired flexibility  Visit Diagnosis: Cervicalgia  Cervicogenic headache  Other muscle spasm  Abnormal posture     Problem List There are no problems to display for this patient.   Rennie Natter PT, DPT 07/25/2021, 7:13 PM  [facility] [address]  [place], [place], [address] Phone: [phone]   Fax:  [phone]  Name: Jawara Latorre MRN: [id number] Date of Birth: 03-14-1991

## 2021-08-06 ENCOUNTER — Ambulatory Visit: Payer: No Typology Code available for payment source | Admitting: Physical Therapy

## 2021-08-06 ENCOUNTER — Other Ambulatory Visit: Payer: Self-pay

## 2021-08-06 ENCOUNTER — Encounter: Payer: Self-pay | Admitting: Physical Therapy

## 2021-08-06 DIAGNOSIS — M542 Cervicalgia: Secondary | ICD-10-CM | POA: Diagnosis not present

## 2021-08-06 DIAGNOSIS — R293 Abnormal posture: Secondary | ICD-10-CM

## 2021-08-06 DIAGNOSIS — M62838 Other muscle spasm: Secondary | ICD-10-CM

## 2021-08-06 DIAGNOSIS — G4486 Cervicogenic headache: Secondary | ICD-10-CM

## 2021-08-06 NOTE — Therapy (Signed)
[facility] [address]  [address] [place], [place], [address] Phone: [phone]   Fax:  [phone]  Physical Therapy Treatment  Patient Details  Name: Christopher Harper MRN: [id number] Date of Birth: [birth year] Referring Provider (PT): Donalee Citrin, MD   Encounter Date: 08/06/2021   PT End of Session - 08/06/21 1711     Visit Number 7    Number of Visits 13    Date for PT Re-Evaluation 08/22/21    Authorization Type VA    Authorization - Visit Number 6    Authorization - Number of Visits 15    PT Start Time 1706    PT Stop Time 1745    PT Time Calculation (min) 39 min    Activity Tolerance Patient tolerated treatment well    Behavior During Therapy [facility] for tasks assessed/performed             History reviewed. No pertinent past medical history.  History reviewed. No pertinent surgical history.  There were no vitals filed for this visit.   Subjective Assessment - 08/06/21 1709     Subjective Patient reports only slight headache today, did not feel any worse after dry needling last session, but not any better either.    Pertinent History hx several concussions, reports hx herniated discs in mid back    Diagnostic tests per pt- had xray and MRI done on c-spine recently- reports that [place] said that it showed narrowing around the nerves; unable to access this today    Patient Stated Goals decrease pain    Currently in Pain? Yes    Pain Score 4     Pain Location Neck    Pain Descriptors / Indicators Tightness;Headache    Pain Type Chronic pain                               [facility] Adult PT Treatment/Exercise - 08/06/21 0001       Exercises   Exercises Neck      Neck Exercises: Machines for Strengthening   UBE (Upper Arm Bike) L2.0 x 3 min forward/3 min back      Manual Therapy   Manual Therapy Joint mobilization;Soft tissue mobilization;Other (comment)    Joint Mobilization NAGS into  rotation, 1st rib mobs    Soft tissue mobilization STM/TPR to cervical paraspinals, L L/S and L UT              Trigger Point Dry Needling - 08/06/21 0001     Consent Given? Yes    Education Handout Provided Previously provided    Muscles Treated Head and Neck Upper trapezius;Levator scapulae;Cervical multifidi    Dry Needling Comments , L side    Upper Trapezius Response Twitch reponse elicited;Palpable increased muscle length    Levator Scapulae Response Twitch response elicited;Palpable increased muscle length    Cervical multifidi Response Twitch reponse elicited;Palpable increased muscle length   C3-C6                 PT Education - 08/06/21 1759     Education Details continued education on chiropractic v. PT,  posture, interventions, exercises, trigger points and dry needling throughout session.    Person(s) Educated Patient    Methods Explanation;Demonstration    Comprehension Verbalized understanding              PT Short Term Goals - 06/27/21 1702  PT SHORT TERM GOAL #1   Title Patient to be independent with initial HEP.    Time 3    Period Weeks    Status Achieved    Target Date 07/05/21               PT Long Term Goals - 07/25/21 1454       PT LONG TERM GOAL #1   Title Patient to be independent with advanced HEP.    Time 4    Period Weeks    Status On-going   continue to progress   Target Date 08/22/21      PT LONG TERM GOAL #2   Title Patient to demonstrate cervical AROM WFL and without pain limiting.    Time 4    Period Weeks    Status On-going    Target Date 08/22/21      PT LONG TERM GOAL #3   Title Patient to report 50% improvement in frequency and intensity of HAs.    Time 4    Period Weeks    Status On-going   no noticeable improvement   Target Date 08/22/21      PT LONG TERM GOAL #4   Title Patient to demonstrate and recall postural correction at rest and with activity for improved postural awareness.    Time  4    Period Weeks    Status On-going    Target Date 08/22/21                   Plan - 08/06/21 1712     Clinical Impression Statement Patient reports no significant changes from previous session, noted still continues to have palpable trigger points in cevical paraspinals, UT, levator (L>>>R), and TPR to suboccipitals reproduced headache symptoms today.  He did not have any LUE parasthesias with first rib mobs, which was an improvement from previous session.  focused session today on manual therapy including dry needling to decrease trigger points in L neck and UT.  Tolerated well but did not report any noticeable improvement immediately following.  He would benefit form continued skilled therapy.    Personal Factors and Comorbidities Age;Comorbidity 2;Past/Current Experience;Time since onset of injury/illness/exacerbation    Comorbidities migraines, concussions    Examination-Activity Limitations Sleep;Caring for Others;Carry;Dressing;Lift    Examination-Participation Restrictions Occupation;Meal Prep;Laundry;Yard Work;Driving;Community Activity;Shop;Cleaning;Church    Stability/Clinical Decision Making Stable/Uncomplicated    Rehab Potential Good    PT Frequency 2x / week    PT Duration 6 weeks    PT Treatment/Interventions ADLs/Self Care Home Management;Cryotherapy;Electrical Stimulation;Moist Heat;Traction;Therapeutic exercise;Therapeutic activities;Functional mobility training;Ultrasound;Neuromuscular re-education;Patient/family education;Manual techniques;Taping;Energy conservation;Dry needling;Passive range of motion    PT Next Visit Plan Continue to progress postural strengthening, modalities including DN PRN    Consulted and Agree with Plan of Care Patient             Patient will benefit from skilled therapeutic intervention in order to improve the following deficits and impairments:  Hypomobility, Decreased activity tolerance, Pain, Increased fascial restricitons,  Increased muscle spasms, Improper body mechanics, Decreased range of motion, Dizziness, Postural dysfunction, Impaired flexibility  Visit Diagnosis: Cervicalgia  Cervicogenic headache  Other muscle spasm  Abnormal posture     Problem List There are no problems to display for this patient.   Jena Gauss PT, DPT 08/06/2021, 6:00 PM  [facility] [address]  [address] [place], [place], [address] Phone: [phone]   Fax:  [phone]  Name: Chriss  Monie MRN: [id number] Date of Birth: 08/22/1991

## 2021-08-08 ENCOUNTER — Encounter: Payer: Non-veteran care | Admitting: Physical Therapy

## 2021-08-15 ENCOUNTER — Ambulatory Visit: Payer: No Typology Code available for payment source | Attending: Neurosurgery | Admitting: Physical Therapy

## 2021-08-15 ENCOUNTER — Other Ambulatory Visit: Payer: Self-pay

## 2021-08-15 ENCOUNTER — Encounter: Payer: Self-pay | Admitting: Physical Therapy

## 2021-08-15 DIAGNOSIS — G4486 Cervicogenic headache: Secondary | ICD-10-CM | POA: Diagnosis present

## 2021-08-15 DIAGNOSIS — M62838 Other muscle spasm: Secondary | ICD-10-CM | POA: Insufficient documentation

## 2021-08-15 DIAGNOSIS — R293 Abnormal posture: Secondary | ICD-10-CM | POA: Insufficient documentation

## 2021-08-15 DIAGNOSIS — M542 Cervicalgia: Secondary | ICD-10-CM | POA: Diagnosis not present

## 2021-08-15 NOTE — Therapy (Signed)
[facility] [address]  [place], [place], [address] Phone: [phone]   Fax:  [phone]  Physical Therapy Treatment  Patient Details  Name: Christopher Harper MRN: [id number] Date of Birth: 03/04/1991 Referring Provider (PT): Kary Kos, MD   Encounter Date: 08/15/2021   PT End of Session - 08/15/21 1451     Visit Number 8    Number of Visits 13    Date for PT Re-Evaluation 08/22/21    Authorization Type VA    Authorization - Visit Number 7    Authorization - Number of Visits 15    PT Start Time 1410    PT Stop Time 1445    PT Time Calculation (min) 35 min    Activity Tolerance Patient tolerated treatment well    Behavior During Therapy [facility] for tasks assessed/performed             History reviewed. No pertinent past medical history.  History reviewed. No pertinent surgical history.  There were no vitals filed for this visit.   Subjective Assessment - 08/15/21 1413     Subjective Pt. arrived about 10 min late for session due to bad traffic.  Reports today is a good day and even just got off work.  Pt. reports this week overall has been good.    Pertinent History hx several concussions, reports hx herniated discs in mid back    Diagnostic tests per pt- had xray and MRI done on c-spine recently- reports that [place] said that it showed narrowing around the nerves; unable to access this today    Patient Stated Goals decrease pain    Currently in Pain? Yes    Pain Score 2    6/10 with L rotation   Pain Location Neck    Pain Orientation Right;Left    Pain Descriptors / Indicators Aching    Pain Type Chronic pain                               [facility] Adult PT Treatment/Exercise - 08/15/21 0001       Exercises   Exercises Neck      Neck Exercises: Machines for Strengthening   UBE (Upper Arm Bike) L2.0 x 3 min forward/3 min back      Manual Therapy   Manual Therapy Joint  mobilization;Soft tissue mobilization;Other (comment)    Joint Mobilization NAGS C3-C6 into rotation, PA mobs grade 1-2    Soft tissue mobilization STMto cervical paraspinals, TPR L L/S and L UT    Other Manual Therapy dry needling              Trigger Point Dry Needling - 08/15/21 0001     Consent Given? Yes    Education Handout Provided Previously provided    Muscles Treated Head and Neck Upper trapezius;Levator scapulae;Cervical multifidi   L UT/LS, L C5-C7, T1-T2 multifidi,   Dry Needling Comments L side    Upper Trapezius Response Twitch reponse elicited;Palpable increased muscle length    Levator Scapulae Response Twitch response elicited;Palpable increased muscle length    Cervical multifidi Response Twitch reponse elicited;Palpable increased muscle length                     PT Short Term Goals - 06/27/21 1702       PT SHORT TERM GOAL #1   Title Patient to be independent with initial HEP.  Time 3    Period Weeks    Status Achieved    Target Date 07/05/21               PT Long Term Goals - 08/15/21 1447       PT LONG TERM GOAL #1   Title Patient to be independent with advanced HEP.    Time 4    Period Weeks    Status On-going   met for current   Target Date 08/22/21      PT LONG TERM GOAL #2   Title Patient to demonstrate cervical AROM WFL and without pain limiting.    Time 4    Period Weeks    Status On-going   improved significantly only pain with L rotation end range   Target Date 08/22/21      PT LONG TERM GOAL #3   Title Patient to report 50% improvement in frequency and intensity of HAs.    Time 4    Period Weeks    Status On-going   did not need any HA medicine this week.   Target Date 08/22/21      PT LONG TERM GOAL #4   Title Patient to demonstrate and recall postural correction at rest and with activity for improved postural awareness.    Time 4    Period Weeks    Status On-going    Target Date 08/22/21                    Plan - 08/15/21 1658     Clinical Impression Statement Patient reports that over last week has had much less neck pain and headaches.  He has not had to take headache medication this week.  He has been moving furniture, and did take some tylenol for muscle aches.  He reports only pain with cervical rotation to L today at end range.  He still demonstrates trigger points in L UT, LS and lower cervical/upper thoracic multifidus today.  After dry needling and STM to these muscles, reported decreased pain with turning and improved ROM.  He would benefit from continued skilled therapy.    Personal Factors and Comorbidities Age;Comorbidity 2;Past/Current Experience;Time since onset of injury/illness/exacerbation    Comorbidities migraines, concussions    Examination-Activity Limitations Sleep;Caring for Others;Carry;Dressing;Lift    Examination-Participation Restrictions Occupation;Meal Prep;Laundry;Yard Work;Driving;Community Activity;Shop;Cleaning;Church    Stability/Clinical Decision Making Stable/Uncomplicated    Rehab Potential Good    PT Frequency 2x / week    PT Duration 6 weeks    PT Treatment/Interventions ADLs/Self Care Home Management;Cryotherapy;Electrical Stimulation;Moist Heat;Traction;Therapeutic exercise;Therapeutic activities;Functional mobility training;Ultrasound;Neuromuscular re-education;Patient/family education;Manual techniques;Taping;Energy conservation;Dry needling;Passive range of motion    PT Next Visit Plan Continue to progress postural strengthening, modalities including DN PRN    Consulted and Agree with Plan of Care Patient             Patient will benefit from skilled therapeutic intervention in order to improve the following deficits and impairments:  Hypomobility, Decreased activity tolerance, Pain, Increased fascial restricitons, Increased muscle spasms, Improper body mechanics, Decreased range of motion, Dizziness, Postural dysfunction, Impaired  flexibility  Visit Diagnosis: Cervicalgia  Cervicogenic headache  Other muscle spasm  Abnormal posture     Problem List There are no problems to display for this patient.   Rennie Natter, PT DPT 08/15/2021, 5:04 PM  [facility] [address]  [place], [place], [address] Phone: [phone]   Fax:  [phone]  Name: Haydyn Girvan MRN: [id number] Date of Birth: 10-Jan-1991

## 2021-08-20 ENCOUNTER — Other Ambulatory Visit: Payer: Self-pay

## 2021-08-20 ENCOUNTER — Encounter: Payer: Self-pay | Admitting: Physical Therapy

## 2021-08-20 ENCOUNTER — Ambulatory Visit: Payer: No Typology Code available for payment source | Admitting: Physical Therapy

## 2021-08-20 DIAGNOSIS — G4486 Cervicogenic headache: Secondary | ICD-10-CM

## 2021-08-20 DIAGNOSIS — M542 Cervicalgia: Secondary | ICD-10-CM | POA: Diagnosis not present

## 2021-08-20 DIAGNOSIS — M62838 Other muscle spasm: Secondary | ICD-10-CM

## 2021-08-20 DIAGNOSIS — R293 Abnormal posture: Secondary | ICD-10-CM

## 2021-08-20 NOTE — Therapy (Signed)
[facility] [place] [address]  [address] [place], [place], [address] Phone: [phone]   Fax:  [phone]  Physical Therapy Progress Note  Progress Note Reporting Period 06/14/2021 to 08/20/2021  See note below for Objective Data and Assessment of Progress/Goals.     Patient Details  Name: Christopher Harper MRN: [id number] Date of Birth: 10/17/1991 Referring Provider (PT): Gary Cram, MD   Encounter Date: 08/20/2021   PT End of Session - 08/20/21 1411     Visit Number 9    Number of Visits 16    Date for PT Re-Evaluation 09/17/21    Authorization Type [facility]    Authorization - Visit Number 8    Authorization - Number of Visits 15    PT Start Time 1408    PT Stop Time 1448    PT Time Calculation (min) 40 min    Activity Tolerance Patient tolerated treatment well    Behavior During Therapy WFL for tasks assessed/performed             History reviewed. No pertinent past medical history.  History reviewed. No pertinent surgical history.  There were no vitals filed for this visit.   Subjective Assessment - 08/20/21 1408     Subjective Patient reports that he continues to do well, not had any headache over the weekend.    Pertinent History hx several concussions, reports hx herniated discs in mid back    Diagnostic tests per pt- had xray and MRI done on c-spine recently- [facility] showed [facility] the nerves; unable to access this today    Patient Stated Goals decrease pain    Currently in Pain? Yes    Pain Score 2     Pain Location Neck    Pain Orientation Right;Left    Pain Descriptors / Indicators Aching    Pain Type Chronic pain                [facility] PT Assessment - 08/20/21 0001       Assessment   Medical Diagnosis Cervicalgia    Referring Provider (PT) Gary Cram, MD      Observation/Other Assessments   Focus on Therapeutic Outcomes (FOTO)  Cervical 52      AROM   Overall  AROM  Due to pain    AROM Assessment Site Cervical    Cervical Flexion 40    Cervical Extension 30    Cervical - Right Side Bend 45    Cervical - Left Side Bend 35    Cervical - Right Rotation 70   no pain   Cervical - Left Rotation 40   increased pain if goes beyond                          [facility] Adult PT Treatment/Exercise - 08/20/21 0001       Exercises   Exercises Neck      Neck Exercises: Machines for Strengthening   UBE (Upper Arm Bike) L2.0 x 3 min forward/3 min back      Manual Therapy   Manual Therapy Joint mobilization;Soft tissue mobilization;Other (comment)    Joint Mobilization NAGS C3-C6 into rotation, PA mobs grade 1-2    Soft tissue mobilization STM to cervical paraspinals, TPR L L/S and L UT    Other Manual Therapy dry needling              Trigger Point   Dry Needling - 08/20/21 0001     Consent Given? Yes    Education Handout Provided Previously provided    Muscles Treated Head and Neck Upper trapezius;Levator scapulae;Cervical multifidi    Dry Needling Comments L side    Upper Trapezius Response Twitch reponse elicited;Palpable increased muscle length    Levator Scapulae Response Twitch response elicited    Cervical multifidi Response Twitch reponse elicited;Palpable increased muscle length                     PT Short Term Goals - 06/27/21 1702       PT SHORT TERM GOAL #1   Title Patient to be independent with initial HEP.    Time 3    Period Weeks    Status Achieved    Target Date 07/05/21               PT Long Term Goals - 08/20/21 1413       PT LONG TERM GOAL #1   Title Patient to be independent with advanced HEP.    Time 4    Period Weeks    Status On-going   met for current   Target Date 09/17/21      PT LONG TERM GOAL #2   Title Patient to demonstrate cervical AROM WFL and without pain limiting.    Time 4    Period Weeks    Status On-going   L rotation 30 deg, but then increased pain  (compared to rotation 70 deg R)   Target Date 09/17/21      PT LONG TERM GOAL #3   Title Patient to report 50% improvement in frequency and intensity of HAs.    Time 4    Period Weeks    Status On-going   08/20/21 - 30% improvement   Target Date 09/17/21      PT LONG TERM GOAL #4   Title Patient to demonstrate and recall postural correction at rest and with activity for improved postural awareness.    Time 4    Period Weeks    Status Achieved    Target Date 09/17/21                   Plan - 08/20/21 1653     Clinical Impression Statement Patient again reported not needing headache medication this week.  His cervical ROM is improving, although continues to have more pain with L rotation and significant deficit in ROM compared to R rotation (40 deg L rotation compared to 70 deg R rotation), which interfers with driving and checking blind spot.  Despite his overall description of improving pain, headache, and ROM, his FOTO decreased from 61 on intake to 52 today.  He still reports tightness in L cervical paraspinals, UT, and levator today.  Poor overall tolerance to manual therapy focusing more on joint mobilizations to improve ROM.  After manual therapy did demonstrate 5 degrees of improvement with L rotation (to 45 deg).  Pt. would benefit from continued skilled therapy.    Personal Factors and Comorbidities Age;Comorbidity 2;Past/Current Experience;Time since onset of injury/illness/exacerbation    Comorbidities migraines, concussions    Examination-Activity Limitations Sleep;Caring for Others;Carry;Dressing;Lift    Examination-Participation Restrictions Occupation;Meal Prep;Laundry;Yard Work;Driving;Community Activity;Shop;Cleaning;Church    Stability/Clinical Decision Making Stable/Uncomplicated    Rehab Potential Good    PT Frequency 2x / week    PT Duration 6 weeks    PT Treatment/Interventions ADLs/Self Care Home Management;Cryotherapy;Electrical Stimulation;Moist  Heat;Traction;Therapeutic exercise;Therapeutic   activities;Functional mobility training;Ultrasound;Neuromuscular re-education;Patient/family education;Manual techniques;Taping;Energy conservation;Dry needling;Passive range of motion    PT Next Visit Plan Continue to progress postural strengthening, modalities including DN PRN    Consulted and Agree with Plan of Care Patient             Patient will benefit from skilled therapeutic intervention in order to improve the following deficits and impairments:  Hypomobility, Decreased activity tolerance, Pain, Increased fascial restricitons, Increased muscle spasms, Improper body mechanics, Decreased range of motion, Dizziness, Postural dysfunction, Impaired flexibility  Visit Diagnosis: Cervicalgia  Cervicogenic headache  Other muscle spasm  Abnormal posture     Problem List There are no problems to display for this patient.   Rennie Natter, PT DPT 08/20/2021, 5:15 PM  [facility] [address]  [place], [place], [address] Phone: [phone]   Fax:  [phone]  Name: Christopher Harper MRN: [id number] Date of Birth: 08/13/1991

## 2021-08-22 ENCOUNTER — Other Ambulatory Visit: Payer: Self-pay

## 2021-08-22 ENCOUNTER — Encounter: Payer: Self-pay | Admitting: Physical Therapy

## 2021-08-22 ENCOUNTER — Ambulatory Visit: Payer: No Typology Code available for payment source | Admitting: Physical Therapy

## 2021-08-22 DIAGNOSIS — G4486 Cervicogenic headache: Secondary | ICD-10-CM

## 2021-08-22 DIAGNOSIS — M542 Cervicalgia: Secondary | ICD-10-CM | POA: Diagnosis not present

## 2021-08-22 DIAGNOSIS — M62838 Other muscle spasm: Secondary | ICD-10-CM

## 2021-08-22 DIAGNOSIS — R293 Abnormal posture: Secondary | ICD-10-CM

## 2021-08-22 NOTE — Therapy (Signed)
[facility] [place] [address]  [place], [place], [address] Phone: [phone]   Fax:  [phone]  Physical Therapy Treatment  Patient Details  Name: Christopher Harper MRN: [id number] Date of Birth: [birth year] Referring Provider (PT): Kary Kos, MD   Encounter Date: 08/22/2021   PT End of Session - 08/22/21 1409     Visit Number 10    Number of Visits 16    Date for PT Re-Evaluation 09/17/21    Authorization Type [facility]    Authorization - Visit Number 8    Authorization - Number of Visits 15    PT Start Time 1062    PT Stop Time 1455    PT Time Calculation (min) 51 min    Activity Tolerance Patient tolerated treatment well    Behavior During Therapy [facility] for tasks assessed/performed             History reviewed. No pertinent past medical history.  History reviewed. No pertinent surgical history.  There were no vitals filed for this visit.   Subjective Assessment - 08/22/21 1407     Subjective Pt reports he has only slight headache today.    Pertinent History hx several concussions, reports hx herniated discs in mid back    Diagnostic tests per pt- had xray and MRI done on c-spine recently- reports that [place] said that it showed narrowing around the nerves; unable to access this today    Patient Stated Goals decrease pain    Currently in Pain? Yes    Pain Score 0-No pain   pain looking down and turning to L   Pain Location Neck    Pain Orientation Left    Pain Descriptors / Indicators Tightness;Headache    Pain Type Chronic pain                               [facility] Adult PT Treatment/Exercise - 08/22/21 0001       Exercises   Exercises Neck      Neck Exercises: Machines for Strengthening   UBE (Upper Arm Bike) L2.0 x 3 min forward/3 min back      Modalities   Modalities Cryotherapy      Cryotherapy   Number Minutes Cryotherapy 10 Minutes    Cryotherapy Location Cervical    Type of  Cryotherapy Ice pack   to decrease muscle soreness     Manual Therapy   Manual Therapy Joint mobilization;Soft tissue mobilization;Other (comment)    Joint Mobilization NAGS C3-C6 into rotation, PA mobs grade 1-2    Soft tissue mobilization STM to cervical paraspinals, TPR L L/S and L UT    Other Manual Therapy dry needling              Trigger Point Dry Needling - 08/22/21 0001     Consent Given? Yes    Education Handout Provided Previously provided    Muscles Treated Head and Neck Upper trapezius;Levator scapulae;Splenius capitus;Semispinalis capitus    Dry Needling Comments L side    Upper Trapezius Response Twitch reponse elicited;Palpable increased muscle length    Levator Scapulae Response Twitch response elicited;Palpable increased muscle length    Splenius capitus Response Twitch reponse elicited;Palpable increased muscle length    Semispinalis capitus Response Twitch reponse elicited;Palpable increased muscle length                     PT Short  Term Goals - 06/27/21 1702       PT SHORT TERM GOAL #1   Title Patient to be independent with initial HEP.    Time 3    Period Weeks    Status Achieved    Target Date 07/05/21               PT Long Term Goals - 08/20/21 1413       PT LONG TERM GOAL #1   Title Patient to be independent with advanced HEP.    Time 4    Period Weeks    Status On-going   met for current   Target Date 09/17/21      PT LONG TERM GOAL #2   Title Patient to demonstrate cervical AROM WFL and without pain limiting.    Time 4    Period Weeks    Status On-going   L rotation 30 deg, but then increased pain (compared to rotation 70 deg R)   Target Date 09/17/21      PT LONG TERM GOAL #3   Title Patient to report 50% improvement in frequency and intensity of HAs.    Time 4    Period Weeks    Status On-going   08/20/21 - 30% improvement   Target Date 09/17/21      PT LONG TERM GOAL #4   Title Patient to demonstrate and  recall postural correction at rest and with activity for improved postural awareness.    Time 4    Period Weeks    Status Achieved    Target Date 09/17/21                   Plan - 08/22/21 1926     Clinical Impression Statement Patient is still demonstrating decreased levels of neck pain and headache, although reporting mild head ache today.   Continued to focus on manual therapy, including dry needling of splenius capitus and cervicis today.  He did report more muscle soreness in this area following manual today, so applied CP to neck to decrease symptoms.  He would benefit from continued skilled therapy.    Personal Factors and Comorbidities Age;Comorbidity 2;Past/Current Experience;Time since onset of injury/illness/exacerbation    Comorbidities migraines, concussions    Examination-Activity Limitations Sleep;Caring for Others;Carry;Dressing;Lift    Examination-Participation Restrictions Occupation;Meal Prep;Laundry;Yard Work;Driving;Community Activity;Shop;Cleaning;Church    Stability/Clinical Decision Making Stable/Uncomplicated    Rehab Potential Good    PT Frequency 2x / week    PT Duration 6 weeks    PT Treatment/Interventions ADLs/Self Care Home Management;Cryotherapy;Electrical Stimulation;Moist Heat;Traction;Therapeutic exercise;Therapeutic activities;Functional mobility training;Ultrasound;Neuromuscular re-education;Patient/family education;Manual techniques;Taping;Energy conservation;Dry needling;Passive range of motion    PT Next Visit Plan Continue to progress postural strengthening, modalities including DN PRN    Consulted and Agree with Plan of Care Patient             Patient will benefit from skilled therapeutic intervention in order to improve the following deficits and impairments:  Hypomobility, Decreased activity tolerance, Pain, Increased fascial restricitons, Increased muscle spasms, Improper body mechanics, Decreased range of motion, Dizziness, Postural  dysfunction, Impaired flexibility  Visit Diagnosis: Cervicalgia  Cervicogenic headache  Other muscle spasm  Abnormal posture     Problem List There are no problems to display for this patient.   Rennie Natter, PT DPT 08/22/2021, 7:29 PM  [facility] [address]  [place], [place], [address] Phone: [phone]   Fax:  [phone]  Name: Nazaire Cordial MRN: [id number] Date of Birth: [birth year]

## 2021-08-27 ENCOUNTER — Other Ambulatory Visit: Payer: Self-pay

## 2021-08-27 ENCOUNTER — Ambulatory Visit: Payer: No Typology Code available for payment source | Admitting: Physical Therapy

## 2021-08-27 ENCOUNTER — Encounter: Payer: Self-pay | Admitting: Physical Therapy

## 2021-08-27 DIAGNOSIS — M542 Cervicalgia: Secondary | ICD-10-CM

## 2021-08-27 DIAGNOSIS — G4486 Cervicogenic headache: Secondary | ICD-10-CM

## 2021-08-27 DIAGNOSIS — R293 Abnormal posture: Secondary | ICD-10-CM

## 2021-08-27 DIAGNOSIS — M62838 Other muscle spasm: Secondary | ICD-10-CM

## 2021-08-27 NOTE — Therapy (Signed)
[facility] [place] [address]  [place], [place], [address] Phone: [phone]   Fax:  [phone]  Physical Therapy Treatment  Patient Details  Name: Christopher Harper MRN: [id number] Date of Birth: 06-26-1991 Referring Provider (PT): Kary Kos, MD   Encounter Date: 08/27/2021   PT End of Session - 08/27/21 1452     Visit Number 11    Number of Visits 16    Date for PT Re-Evaluation 09/17/21    Authorization Type [facility]    Authorization - Visit Number 10    Authorization - Number of Visits 15    PT Start Time 1856    PT Stop Time 1456    PT Time Calculation (min) 51 min    Activity Tolerance Patient tolerated treatment well    Behavior During Therapy [facility] for tasks assessed/performed             History reviewed. No pertinent past medical history.  History reviewed. No pertinent surgical history.  There were no vitals filed for this visit.   Subjective Assessment - 08/27/21 1406     Subjective Pt reports he has only slight headache today.  Had a very busy weekend and limited rest, which he thinks is why he has a headache.    Pertinent History hx several concussions, reports hx herniated discs in mid back    Diagnostic tests per pt- had xray and MRI done on c-spine recently- reports that [place] said that it showed narrowing around the nerves; unable to access this today    Patient Stated Goals decrease pain    Currently in Pain? Yes    Pain Score 3     Pain Location Neck    Pain Orientation Left    Pain Descriptors / Indicators Aching;Headache                [facility] PT Assessment - 08/27/21 0001       Assessment   Medical Diagnosis Cervicalgia    Referring Provider (PT) Kary Kos, MD      AROM   AROM Assessment Site Cervical    Cervical - Right Rotation 70    Cervical - Left Rotation 65   post treatment                          [facility] Adult PT Treatment/Exercise - 08/27/21 0001        Exercises   Exercises Neck      Neck Exercises: Machines for Strengthening   UBE (Upper Arm Bike) L2.0 x 3 min forward/3 min back      Neck Exercises: Supine   Other Supine Exercise self mobs on foam roller for thoracic extension      Neck Exercises: Sidelying   Other Sidelying Exercise open books x 10 bil      Neck Exercises: Prone   Other Prone Exercise --      Neck Exercises: Stretches   Levator Stretch Right;Left;3 reps;10 seconds    Levator Stretch Limitations limited on L, no overpressure      Modalities   Modalities Cryotherapy      Cryotherapy   Number Minutes Cryotherapy 10 Minutes    Cryotherapy Location Cervical    Type of Cryotherapy Ice pack      Manual Therapy   Manual Therapy Joint mobilization;Soft tissue mobilization;Myofascial release    Joint Mobilization NAGs into rotation, UPA mobs    Soft tissue mobilization STM  to cervical paraspinals, TPR L L/S and L UT    Myofascial Release B suboccipital release    Manual Traction 3 x 15 sec pulls                       PT Short Term Goals - 06/27/21 1702       PT SHORT TERM GOAL #1   Title Patient to be independent with initial HEP.    Time 3    Period Weeks    Status Achieved    Target Date 07/05/21               PT Long Term Goals - 08/27/21 1455       PT LONG TERM GOAL #1   Title Patient to be independent with advanced HEP.    Time 4    Period Weeks    Status On-going   met for current     PT LONG TERM GOAL #2   Title Patient to demonstrate cervical AROM WFL and without pain limiting.    Time 4    Period Weeks    Status On-going   L rotation 30 deg, but then increased pain (compared to rotation 70 deg R)     PT LONG TERM GOAL #3   Title Patient to report 50% improvement in frequency and intensity of HAs.    Time 4    Period Weeks    Status On-going   08/20/21 - 30% improvement     PT LONG TERM GOAL #4   Title Patient to demonstrate and recall postural correction at  rest and with activity for improved postural awareness.    Time 4    Period Weeks    Status Achieved                   Plan - 08/27/21 1452     Clinical Impression Statement Patient reported feeling catch limiting L cervical rotation today.  He demonstrated significant tightness in thoracic spine with rotation, unable to complete open books much past 90 deg on L side.  Focused today on NAGs and UPAs with manual therapy, along with STM/TPR to L levator and semispinalus capitus/cervicus today, post treatment he was able to rotate to L to 65deg, which is significant improvement.  CP applied to neck to decrease post session soreness.  He would benefit from continued skilled therapy.    Personal Factors and Comorbidities Age;Comorbidity 2;Past/Current Experience;Time since onset of injury/illness/exacerbation    Comorbidities migraines, concussions    Examination-Activity Limitations Sleep;Caring for Others;Carry;Dressing;Lift    Examination-Participation Restrictions Occupation;Meal Prep;Laundry;Yard Work;Driving;Community Activity;Shop;Cleaning;Church    Stability/Clinical Decision Making Stable/Uncomplicated    Rehab Potential Good    PT Frequency 2x / week    PT Duration 6 weeks    PT Treatment/Interventions ADLs/Self Care Home Management;Cryotherapy;Electrical Stimulation;Moist Heat;Traction;Therapeutic exercise;Therapeutic activities;Functional mobility training;Ultrasound;Neuromuscular re-education;Patient/family education;Manual techniques;Taping;Energy conservation;Dry needling;Passive range of motion    PT Next Visit Plan Continue to progress postural strengthening, modalities including DN PRN    Consulted and Agree with Plan of Care Patient             Patient will benefit from skilled therapeutic intervention in order to improve the following deficits and impairments:  Hypomobility, Decreased activity tolerance, Pain, Increased fascial restricitons, Increased muscle  spasms, Improper body mechanics, Decreased range of motion, Dizziness, Postural dysfunction, Impaired flexibility  Visit Diagnosis: Cervicalgia  Cervicogenic headache  Other muscle spasm  Abnormal posture  Problem List There are no problems to display for this patient.   Rennie Natter, PT DPT 08/27/2021, 2:57 PM  [facility] [address]  [place], [place], [address] Phone: [phone]   Fax:  [phone]  Name: Christopher Harper MRN: [id number] Date of Birth: Apr 11, 1991

## 2021-08-29 ENCOUNTER — Ambulatory Visit: Payer: No Typology Code available for payment source | Admitting: Physical Therapy

## 2021-09-03 ENCOUNTER — Ambulatory Visit: Payer: No Typology Code available for payment source | Admitting: Physical Therapy

## 2021-09-05 ENCOUNTER — Ambulatory Visit: Payer: No Typology Code available for payment source | Admitting: Physical Therapy

## 2021-09-05 ENCOUNTER — Other Ambulatory Visit: Payer: Self-pay

## 2021-09-05 ENCOUNTER — Encounter: Payer: Self-pay | Admitting: Physical Therapy

## 2021-09-05 DIAGNOSIS — M542 Cervicalgia: Secondary | ICD-10-CM | POA: Diagnosis not present

## 2021-09-05 DIAGNOSIS — M62838 Other muscle spasm: Secondary | ICD-10-CM

## 2021-09-05 DIAGNOSIS — R293 Abnormal posture: Secondary | ICD-10-CM

## 2021-09-05 DIAGNOSIS — G4486 Cervicogenic headache: Secondary | ICD-10-CM

## 2021-09-05 NOTE — Therapy (Addendum)
PHYSICAL THERAPY DISCHARGE SUMMARY (11/22/21)   Visits from Start of Care: 12  Current functional level related to goals / functional outcomes: Decreased incidence of headache and neck pain   Remaining deficits: Intermittent head aches and migraines   Education / Equipment: HEP  Plan: Patient agrees to discharge.  Patient goals were not met. Patient is being discharged due to meeting the stated rehab goals.  Patient had missed several visits due to family illness, and current referral has expired.  Patient informed that new referral would be needed, patient did not request new referral.   Rennie Natter, PT, DPT  11/22/2021    [facility] [address] [place], [place], [address] Phone: [phone]   Fax:  [phone]  Physical Therapy Treatment  Patient Details  Name: Christopher Harper MRN: [id number] Date of Birth: 04-06-1991 Referring Provider (PT): Kary Kos, MD   Encounter Date: 09/05/2021   PT End of Session - 09/05/21 1402     Visit Number 12    Number of Visits 16    Date for PT Re-Evaluation 09/17/21    Authorization Type [facility]    Authorization - Visit Number 11    Authorization - Number of Visits 15    PT Start Time 0263    PT Stop Time 1445    PT Time Calculation (min) 42 min    Activity Tolerance Patient tolerated treatment well    Behavior During Therapy [facility] for tasks assessed/performed             History reviewed. No pertinent past medical history.  History reviewed. No pertinent surgical history.  There were no vitals filed for this visit.   Subjective Assessment - 09/05/21 1403     Subjective Christopher Harper reports that he had a very eventful week, missed visits due to urgent visits for wife's pregnancy, and while his neck still feels good he had a severe migraine this weekend, better today but still has headache.    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 Yes    Pain Score 5     Pain Location Head    Pain Descriptors / Indicators Headache                               [facility] Adult PT Treatment/Exercise - 09/05/21 0001       Exercises   Exercises Neck      Neck Exercises: Machines for Strengthening   UBE (Upper Arm Bike) L2.0 x 3 min forward/3 min back      Manual Therapy   Manual Therapy Soft tissue mobilization;Joint mobilization;Myofascial release;Manual Traction;Other (comment)    Joint Mobilization NAGs into rotation, UPA mobs    Soft tissue mobilization STM to cervical paraspinals, TPR L L/S and L UT    Myofascial Release B suboccipital release    Manual Traction 3 x 15 sec pulls    Other Manual Therapy dry needling              Trigger Point Dry Needling - 09/05/21 0001     Consent Given? Yes    Education Handout  Provided Previously provided    Muscles Treated Head and Neck Suboccipitals    Dry Needling Comments L side    Suboccipitals Response Palpable increased muscle length                     PT Short Term Goals - 06/27/21 1702       PT SHORT TERM GOAL #1   Title Patient to be independent with initial HEP.    Time 3    Period Weeks    Status Achieved    Target Date 07/05/21               PT Long Term Goals - 08/27/21 1455       PT LONG TERM GOAL #1   Title Patient to be independent with advanced HEP.    Time 4    Period Weeks    Status On-going   met for current     PT LONG TERM GOAL #2   Title Patient to demonstrate cervical AROM WFL and without pain limiting.    Time 4    Period Weeks    Status On-going   L rotation 30 deg, but then increased pain (compared to rotation 70 deg R)     PT LONG TERM GOAL #3   Title  Patient to report 50% improvement in frequency and intensity of HAs.    Time 4    Period Weeks    Status On-going   08/20/21 - 30% improvement     PT LONG TERM GOAL #4   Title Patient to demonstrate and recall postural correction at rest and with activity for improved postural awareness.    Time 4    Period Weeks    Status Achieved                   Plan - 09/05/21 1654     Clinical Impression Statement Patient arrived with headache today, agreed to dry needling of L suboccipitals which he tolerated well, reporting only pressure, with palpable improvement in muscle tightness, followed by manual therapy to decrease muscle tightness throughout cervical musculature, including L SCM which was also very tight and painful today.  At end of session reported decreased pain and only had tightness in frontalis with mild headache.  He would benefit from continued skilled therapy.    Personal Factors and Comorbidities Age;Comorbidity 2;Past/Current Experience;Time since onset of injury/illness/exacerbation    Comorbidities migraines, concussions    Examination-Activity Limitations Sleep;Caring for Others;Carry;Dressing;Lift    Examination-Participation Restrictions Occupation;Meal Prep;Laundry;Yard Work;Driving;Community Activity;Shop;Cleaning;Church    Stability/Clinical Decision Making Stable/Uncomplicated    Rehab Potential Good    PT Frequency 2x / week    PT Duration 6 weeks    PT Treatment/Interventions ADLs/Self Care Home Management;Cryotherapy;Electrical Stimulation;Moist Heat;Traction;Therapeutic exercise;Therapeutic activities;Functional mobility training;Ultrasound;Neuromuscular re-education;Patient/family education;Manual techniques;Taping;Energy conservation;Dry needling;Passive range of motion    PT Next Visit Plan Continue to progress postural strengthening, modalities including DN PRN    Consulted and Agree with Plan of Care Patient             Patient will benefit from  skilled therapeutic intervention in order to improve the following deficits and impairments:  Hypomobility, Decreased activity tolerance, Pain, Increased fascial restricitons, Increased muscle spasms, Improper body mechanics, Decreased range of motion, Dizziness, Postural dysfunction, Impaired flexibility  Visit Diagnosis: Cervicalgia  Cervicogenic headache  Other muscle spasm  Abnormal posture     Problem List There are no problems to display for this patient.  Rennie Natter, PT, DPT 09/05/2021, 4:57 PM  [facility] [address]  [place], [place], [address] Phone: [phone]   Fax:  [phone]  Name: Christopher Harper MRN: [id number] Date of Birth: 1991-06-24

## 2021-10-17 ENCOUNTER — Ambulatory Visit: Payer: No Typology Code available for payment source | Admitting: Physical Therapy

## 2021-10-22 ENCOUNTER — Ambulatory Visit: Payer: No Typology Code available for payment source | Admitting: Physical Therapy

## 2021-10-24 ENCOUNTER — Encounter: Payer: Non-veteran care | Admitting: Physical Therapy

## 2021-10-29 ENCOUNTER — Encounter: Payer: Non-veteran care | Admitting: Physical Therapy
